# Patient Record
Sex: Female | Born: 1995 | Race: Black or African American | Hispanic: No | Marital: Single | Smoking: Never smoker
Health system: Southern US, Community
[De-identification: ages and names within clinical notes are randomized; demographics above are authoritative.]

## PROBLEM LIST (undated history)

## (undated) HISTORY — PX: OTHER SURGICAL HISTORY: SHX169

---
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
## 2015-02-28 IMAGING — CR DG WRIST COMPLETE 3+V*R*
2 series · 2 of 2 positions shown · non-contrast
Comparison: None.

CLINICAL DATA: Right wrist pain since an injury 2 months ago.

EXAM:
RIGHT WRIST - COMPLETE 3+ VIEW

[PA]
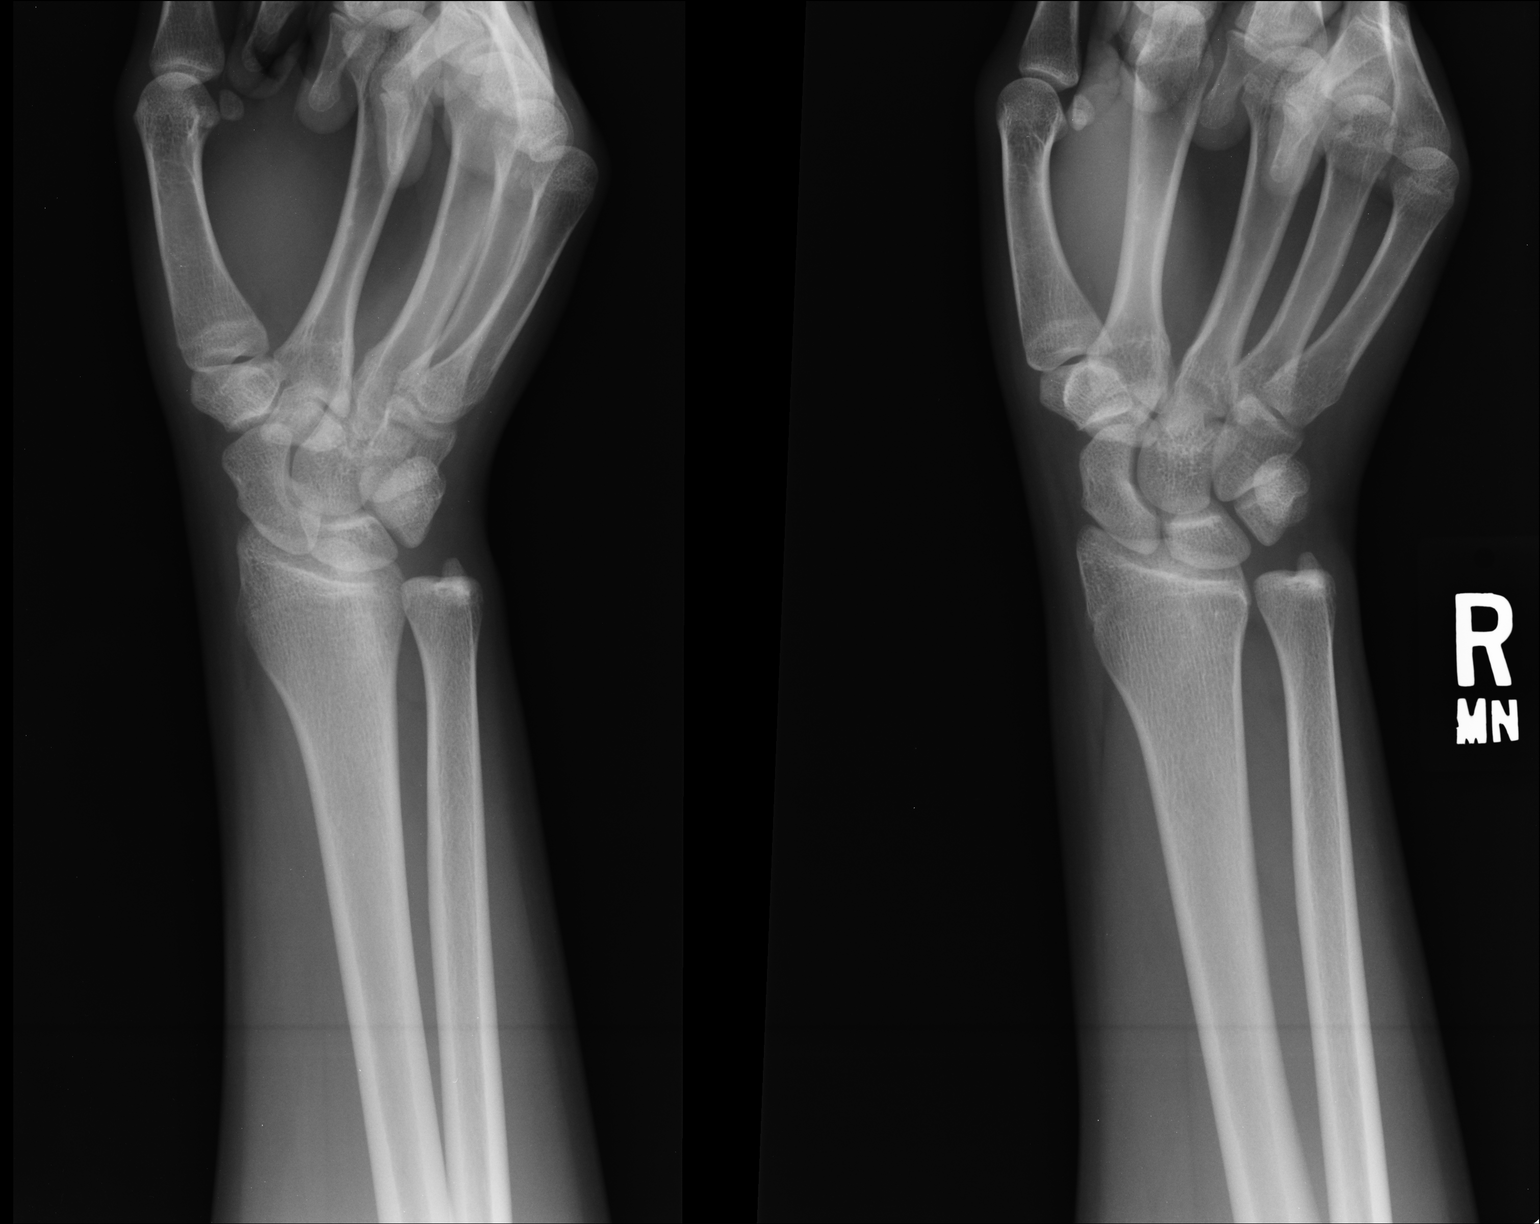

[lateral]
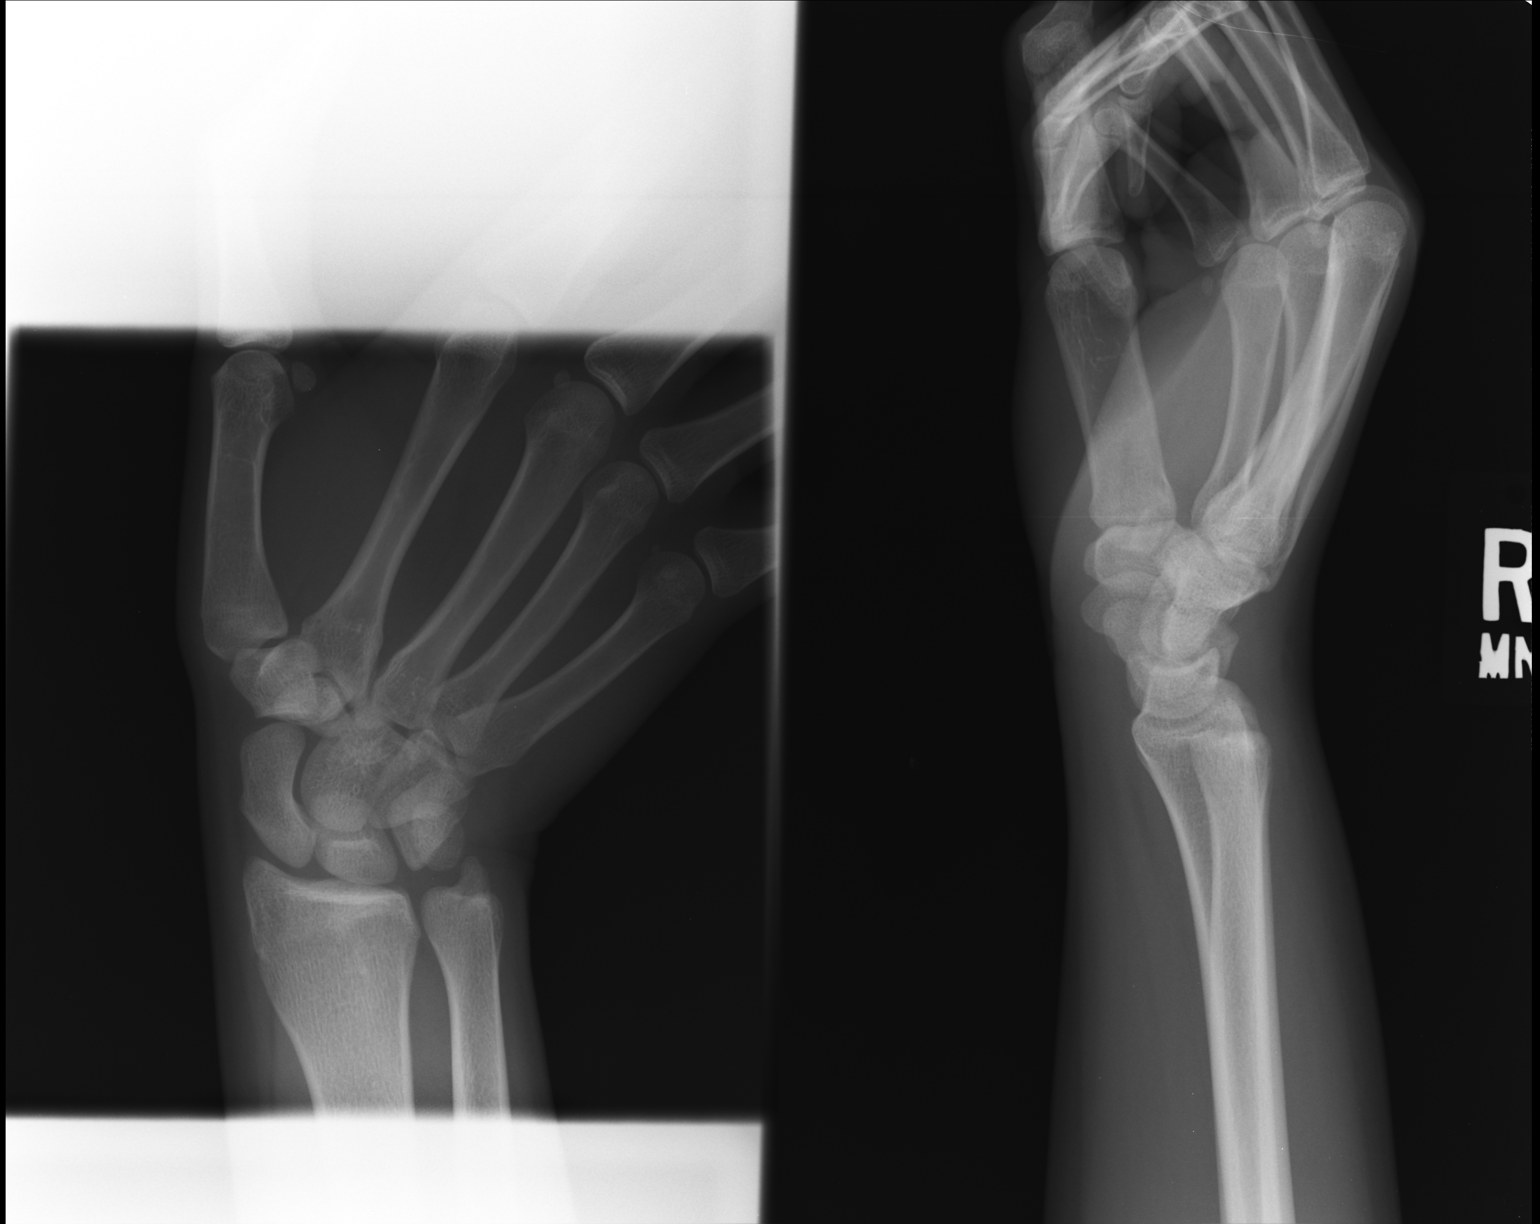

[2 of 2 positions shown; findings below may reference images not displayed]

FINDINGS: Imaged bones, joints and soft tissues appear normal.
IMPRESSION: Negative exam.

## 2018-11-30 ENCOUNTER — Other Ambulatory Visit: Payer: Self-pay

## 2018-11-30 DIAGNOSIS — Z20822 Contact with and (suspected) exposure to covid-19: Secondary | ICD-10-CM

## 2018-12-03 LAB — NOVEL CORONAVIRUS, NAA: SARS-CoV-2, NAA: NOT DETECTED

## 2019-01-04 ENCOUNTER — Other Ambulatory Visit: Payer: Self-pay

## 2019-01-04 DIAGNOSIS — Z20822 Contact with and (suspected) exposure to covid-19: Secondary | ICD-10-CM

## 2019-01-05 LAB — NOVEL CORONAVIRUS, NAA: SARS-CoV-2, NAA: NOT DETECTED

## 2019-01-19 ENCOUNTER — Other Ambulatory Visit: Payer: Self-pay

## 2019-01-19 DIAGNOSIS — Z20822 Contact with and (suspected) exposure to covid-19: Secondary | ICD-10-CM

## 2019-01-21 LAB — NOVEL CORONAVIRUS, NAA: SARS-CoV-2, NAA: NOT DETECTED

## 2019-09-12 ENCOUNTER — Ambulatory Visit: Payer: Self-pay | Attending: Internal Medicine

## 2019-09-12 DIAGNOSIS — Z23 Encounter for immunization: Secondary | ICD-10-CM

## 2019-09-12 NOTE — Progress Notes (Signed)
   Covid-19 Vaccination Clinic  Name:  Alyssa Ferguson    MRN: [id number] DOB: 03/19/1996  09/12/2019  [name] was observed post Covid-19 immunization for 15 minutes without incident. She was provided with Vaccine Information Sheet and instruction to access the V-Safe system.   Ms. Candy was instructed to call 911 with any severe reactions post vaccine: Marland Kitchen Difficulty breathing  . Swelling of face and throat  . A fast heartbeat  . A bad rash all over body  . Dizziness and weakness   Immunizations Administered    Name Date Dose VIS Date Route   Moderna COVID-19 Vaccine 09/12/2019  2:12 PM 0.5 mL 04/2019 Intramuscular   Manufacturer: Moderna   Lot: [id number]   NDC: 92415-516-14

## 2019-10-17 ENCOUNTER — Ambulatory Visit: Payer: Self-pay | Attending: Internal Medicine

## 2019-10-17 DIAGNOSIS — Z23 Encounter for immunization: Secondary | ICD-10-CM

## 2019-10-17 NOTE — Progress Notes (Signed)
   Covid-19 Vaccination Clinic  Name:  Alyssa Ferguson    MRN: [id number] DOB: 04/15/1996  10/17/2019  Ms. Alyssa Ferguson was observed post Covid-19 immunization for 15 minutes without incident. She was provided with Vaccine Information Sheet and instruction to access the V-Safe system.   Ms. Alyssa Ferguson was instructed to call 911 with any severe reactions post vaccine: Marland Kitchen Difficulty breathing  . Swelling of face and throat  . A fast heartbeat  . A bad rash all over body  . Dizziness and weakness   Immunizations Administered    Name Date Dose VIS Date Route   Moderna COVID-19 Vaccine 10/17/2019  2:08 PM 0.5 mL 04/2019 Intramuscular   Manufacturer: Moderna   Lot: [id number]   NDC: 49201-007-12
